# Patient Record
Sex: Female | Born: 2000 | Race: White | Hispanic: No | Marital: Single | Smoking: Never smoker
Health system: Southern US, Community
[De-identification: ages and names within clinical notes are randomized; demographics above are authoritative.]

## PROBLEM LIST (undated history)

## (undated) DIAGNOSIS — L0591 Pilonidal cyst without abscess: Secondary | ICD-10-CM

## (undated) HISTORY — PX: OTHER SURGICAL HISTORY: SHX169

## (undated) HISTORY — DX: Pilonidal cyst without abscess: L05.91

---
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
## 2018-06-27 ENCOUNTER — Other Ambulatory Visit: Payer: Self-pay

## 2018-06-27 ENCOUNTER — Emergency Department
Admission: EM | Admit: 2018-06-27 | Discharge: 2018-06-27 | Disposition: A | Discharge status: Home / Self Care | Payer: 59 | Attending: Emergency Medicine | Admitting: Emergency Medicine

## 2018-06-27 DIAGNOSIS — M79672 Pain in left foot: Secondary | ICD-10-CM | POA: Diagnosis present

## 2018-06-27 DIAGNOSIS — Z5321 Procedure and treatment not carried out due to patient leaving prior to being seen by health care provider: Secondary | ICD-10-CM | POA: Diagnosis not present

## 2018-06-27 NOTE — ED Triage Notes (Signed)
Pt c/o having a piece of broken glass in her left foot since yesterday.

## 2019-03-17 DIAGNOSIS — Z91410 Personal history of adult physical and sexual abuse: Secondary | ICD-10-CM

## 2019-03-18 ENCOUNTER — Ambulatory Visit: Payer: 59

## 2019-03-18 ENCOUNTER — Other Ambulatory Visit: Payer: Self-pay

## 2019-03-20 ENCOUNTER — Emergency Department
Admission: EM | Admit: 2019-03-20 | Discharge: 2019-03-20 | Disposition: A | Discharge status: Home / Self Care | Payer: Self-pay

## 2019-05-19 ENCOUNTER — Other Ambulatory Visit: Payer: Self-pay

## 2019-05-19 ENCOUNTER — Emergency Department: Payer: BC Managed Care – PPO

## 2019-05-19 ENCOUNTER — Emergency Department
Admission: EM | Admit: 2019-05-19 | Discharge: 2019-05-19 | Disposition: A | Discharge status: Home / Self Care | Payer: BC Managed Care – PPO | Attending: Emergency Medicine | Admitting: Emergency Medicine

## 2019-05-19 ENCOUNTER — Encounter: Payer: Self-pay | Admitting: *Deleted

## 2019-05-19 DIAGNOSIS — S60221A Contusion of right hand, initial encounter: Secondary | ICD-10-CM | POA: Diagnosis not present

## 2019-05-19 DIAGNOSIS — S60229A Contusion of unspecified hand, initial encounter: Secondary | ICD-10-CM

## 2019-05-19 DIAGNOSIS — Y939 Activity, unspecified: Secondary | ICD-10-CM | POA: Diagnosis not present

## 2019-05-19 DIAGNOSIS — Y92410 Unspecified street and highway as the place of occurrence of the external cause: Secondary | ICD-10-CM | POA: Insufficient documentation

## 2019-05-19 DIAGNOSIS — Y999 Unspecified external cause status: Secondary | ICD-10-CM | POA: Diagnosis not present

## 2019-05-19 DIAGNOSIS — S6991XA Unspecified injury of right wrist, hand and finger(s), initial encounter: Secondary | ICD-10-CM | POA: Diagnosis present

## 2019-05-19 DIAGNOSIS — S60222A Contusion of left hand, initial encounter: Secondary | ICD-10-CM | POA: Insufficient documentation

## 2019-05-19 DIAGNOSIS — S8001XA Contusion of right knee, initial encounter: Secondary | ICD-10-CM | POA: Insufficient documentation

## 2019-05-19 MED ORDER — ONDANSETRON 4 MG PO TBDP
4.0000 mg | ORAL_TABLET | Freq: Once | ORAL | Status: AC
  Administered 2019-05-19: 4 mg via ORAL
  Filled 2019-05-19: qty 1

## 2019-05-19 MED ORDER — CEPHALEXIN 500 MG PO CAPS: 500.0000 mg | ORAL_CAPSULE | Freq: Three times a day (TID) | ORAL | 0 refills | Status: AC

## 2019-05-19 MED ORDER — OXYCODONE-ACETAMINOPHEN 5-325 MG PO TABS
1.0000 | ORAL_TABLET | Freq: Once | ORAL | Status: AC
  Administered 2019-05-19: 23:00:00 1 via ORAL
  Filled 2019-05-19: qty 1

## 2019-05-19 MED ORDER — HYDROCODONE-ACETAMINOPHEN 5-325 MG PO TABS: 1.0000 | ORAL_TABLET | Freq: Four times a day (QID) | ORAL | 0 refills | Status: AC | PRN

## 2019-05-19 MED ORDER — OXYCODONE-ACETAMINOPHEN 5-325 MG PO TABS
1.0000 | ORAL_TABLET | Freq: Once | ORAL | Status: AC
  Administered 2019-05-19: 20:00:00 1 via ORAL
  Filled 2019-05-19: qty 1

## 2019-05-19 NOTE — Discharge Instructions (Signed)
Follow-up with your primary care provider if any continued problems or concerns.  Clean areas on your hands and your knee daily with mild soap and water and watch for any signs of infection.  A prescription for hydrocodone was sent to your pharmacy.  Take this as needed for pain.  You may also take ibuprofen with this medication if additional medication is needed for pain.  You will most likely be sore and multiple places tomorrow that are not hurting currently.  Return to the emergency department if any severe worsening of your symptoms or urgent concerns.
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
## 2019-05-19 NOTE — ED Notes (Signed)
Hand wounds debrided by Annice Pih PA. Wounds irrigated with NS on both hands, right knee, elbows and arms. Wounds dressed with nonstick dressing and gauze. Wounds clean and dressings applied. Bandaids applied to abrasions on elbows and forearms.

## 2020-02-07 ENCOUNTER — Ambulatory Visit: Payer: Self-pay
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
## 2022-07-24 ENCOUNTER — Ambulatory Visit
Admission: RE | Admit: 2022-07-24 | Discharge: 2022-07-24 | Disposition: A | Discharge status: Home / Self Care | Payer: BC Managed Care – PPO | Source: Ambulatory Visit | Attending: Family Medicine | Admitting: Family Medicine

## 2022-07-24 VITALS — BP 125/79 | HR 68 | Temp 98.5°F | Resp 16

## 2022-07-24 DIAGNOSIS — N760 Acute vaginitis: Secondary | ICD-10-CM | POA: Diagnosis not present

## 2022-07-24 DIAGNOSIS — B3731 Acute candidiasis of vulva and vagina: Secondary | ICD-10-CM | POA: Insufficient documentation

## 2022-07-24 DIAGNOSIS — B9689 Other specified bacterial agents as the cause of diseases classified elsewhere: Secondary | ICD-10-CM | POA: Insufficient documentation

## 2022-07-24 DIAGNOSIS — Z202 Contact with and (suspected) exposure to infections with a predominantly sexual mode of transmission: Secondary | ICD-10-CM | POA: Insufficient documentation

## 2022-07-24 DIAGNOSIS — N3000 Acute cystitis without hematuria: Secondary | ICD-10-CM | POA: Diagnosis not present

## 2022-07-24 LAB — URINALYSIS, W/ REFLEX TO CULTURE (INFECTION SUSPECTED)
Bilirubin Urine: NEGATIVE
Glucose, UA: NEGATIVE mg/dL
Ketones, ur: NEGATIVE mg/dL
Nitrite: NEGATIVE
Protein, ur: 30 mg/dL — AB
Specific Gravity, Urine: 1.025 (ref 1.005–1.030)
WBC, UA: >50 WBC/hpf (ref 0–5)
pH: 7 (ref 5.0–8.0)

## 2022-07-24 LAB — WET PREP, GENITAL
Sperm: NONE SEEN
Trich, Wet Prep: NONE SEEN
WBC, Wet Prep HPF POC: <10 — AB (ref <10)

## 2022-07-24 MED ORDER — METRONIDAZOLE 500 MG PO TABS: 500.0000 mg | ORAL_TABLET | Freq: Two times a day (BID) | ORAL | 0 refills | Status: AC

## 2022-07-24 MED ORDER — DOXYCYCLINE HYCLATE 100 MG PO CAPS: 100.0000 mg | ORAL_CAPSULE | Freq: Two times a day (BID) | ORAL | 0 refills | Status: AC

## 2022-07-24 MED ORDER — CEFTRIAXONE SODIUM 500 MG IJ SOLR
500.0000 mg | Freq: Once | INTRAMUSCULAR | Status: AC
  Administered 2022-07-24: 500 mg via INTRAMUSCULAR

## 2022-07-24 MED ORDER — FLUCONAZOLE 150 MG PO TABS: 150.0000 mg | ORAL_TABLET | ORAL | 0 refills | Status: AC

## 2022-07-24 MED ORDER — CEFDINIR 300 MG PO CAPS: 300.0000 mg | ORAL_CAPSULE | Freq: Two times a day (BID) | ORAL | 0 refills | Status: AC
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
## 2022-07-24 NOTE — Discharge Instructions (Addendum)
You have a yeast infection, a bacterial vaginal infection and a urinary tract infection. Stop by the pharmacy to pick up your medications.    Your STD test results will be available in the next 72 hours. If positive, someone will contact you.  You should see your results in your MyChart account.

## 2022-07-25 LAB — CERVICOVAGINAL ANCILLARY ONLY
Chlamydia: NEGATIVE
Comment: NEGATIVE
Comment: NORMAL
Neisseria Gonorrhea: NEGATIVE

## 2022-07-25 LAB — URINE CULTURE: Culture: NO GROWTH
